# Patient Record
Sex: Male | Born: 2000 | Race: White | Hispanic: No | Marital: Single | Smoking: Never smoker
Health system: Southern US, Community
[De-identification: ages and names within clinical notes are randomized; demographics above are authoritative.]

---
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
## 2021-03-02 ENCOUNTER — Ambulatory Visit: Payer: Self-pay

## 2021-03-02 ENCOUNTER — Other Ambulatory Visit: Payer: Self-pay | Admitting: Family Medicine

## 2021-03-02 ENCOUNTER — Other Ambulatory Visit: Payer: Self-pay

## 2021-03-02 DIAGNOSIS — S20211A Contusion of right front wall of thorax, initial encounter: Secondary | ICD-10-CM

## 2021-12-29 IMAGING — DX DG RIBS 2V*R*
4 series · 4 of 4 positions shown · non-contrast
Comparison: None.

CLINICAL DATA: Right chest pain after blunt trauma today. Initial
encounter.

EXAM:
RIGHT RIBS - 2 VIEW

[rib pa]
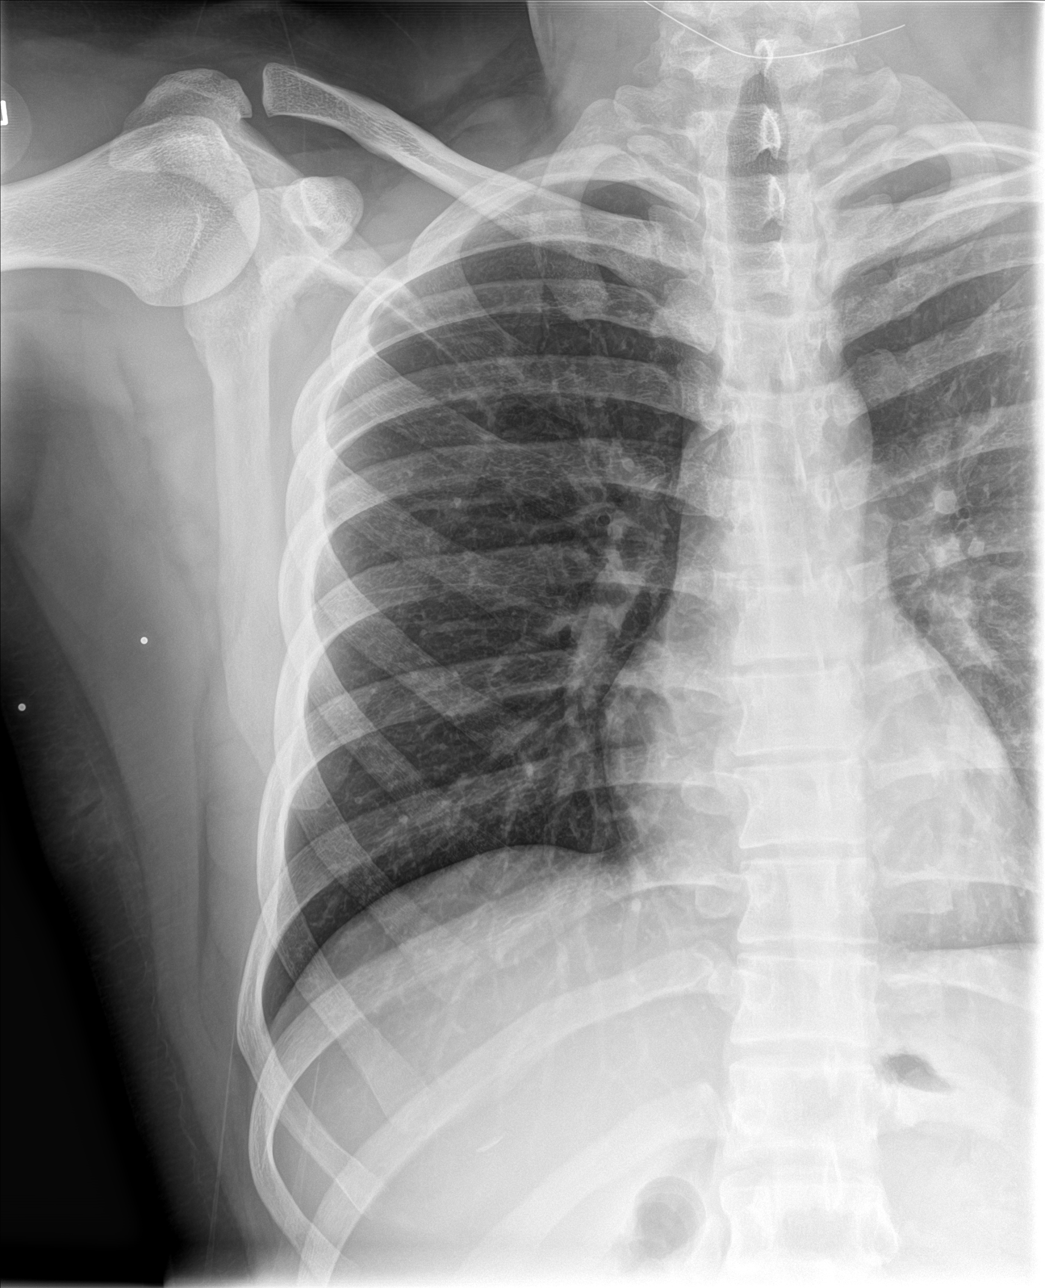

[rib obl (1 of 2)]
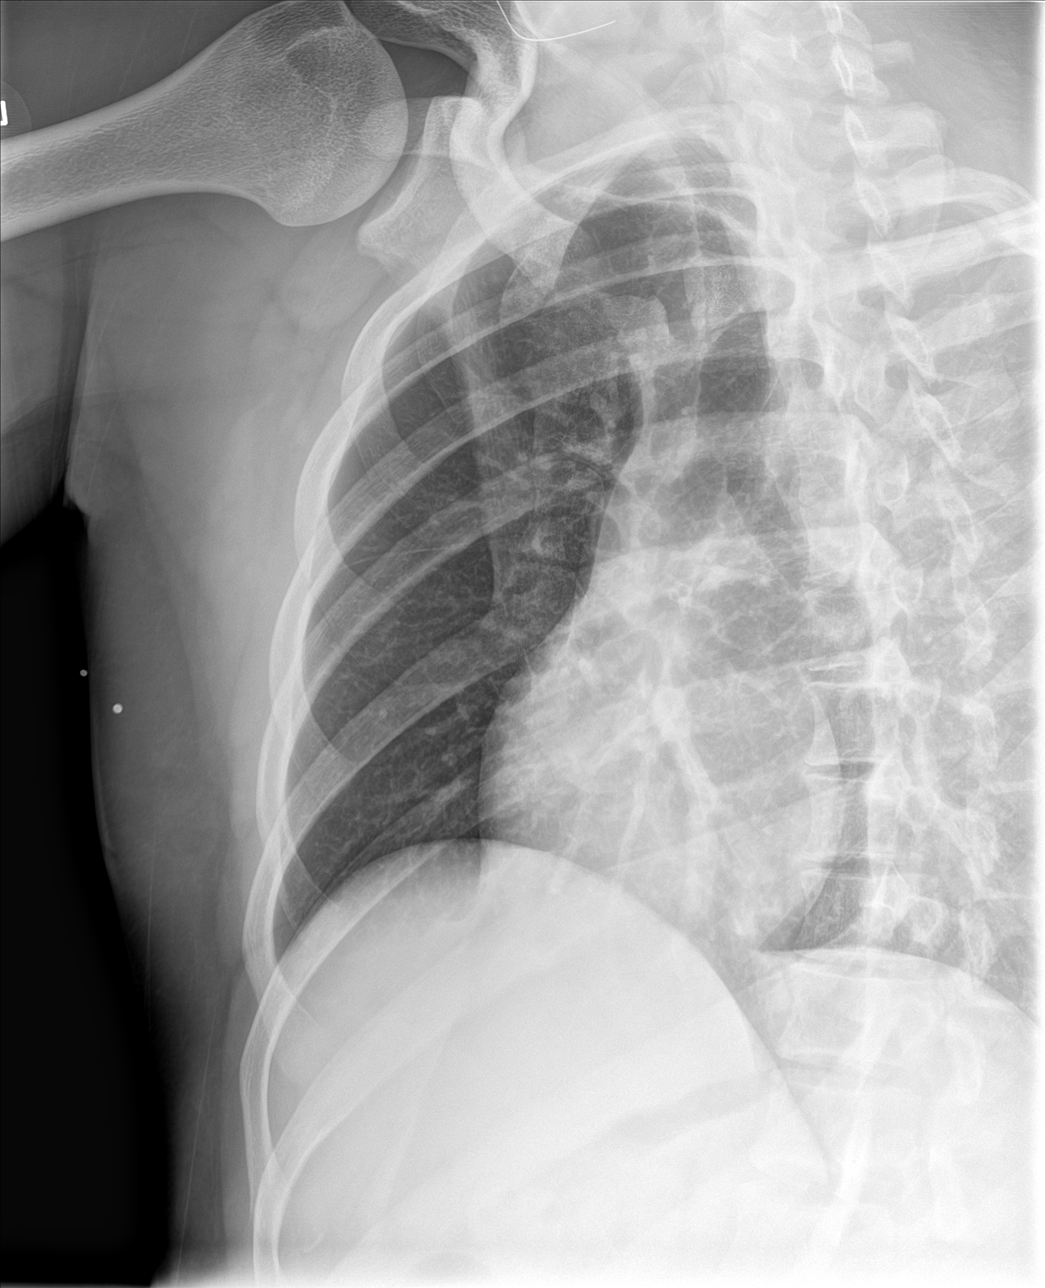

[rib obl (2 of 2)]
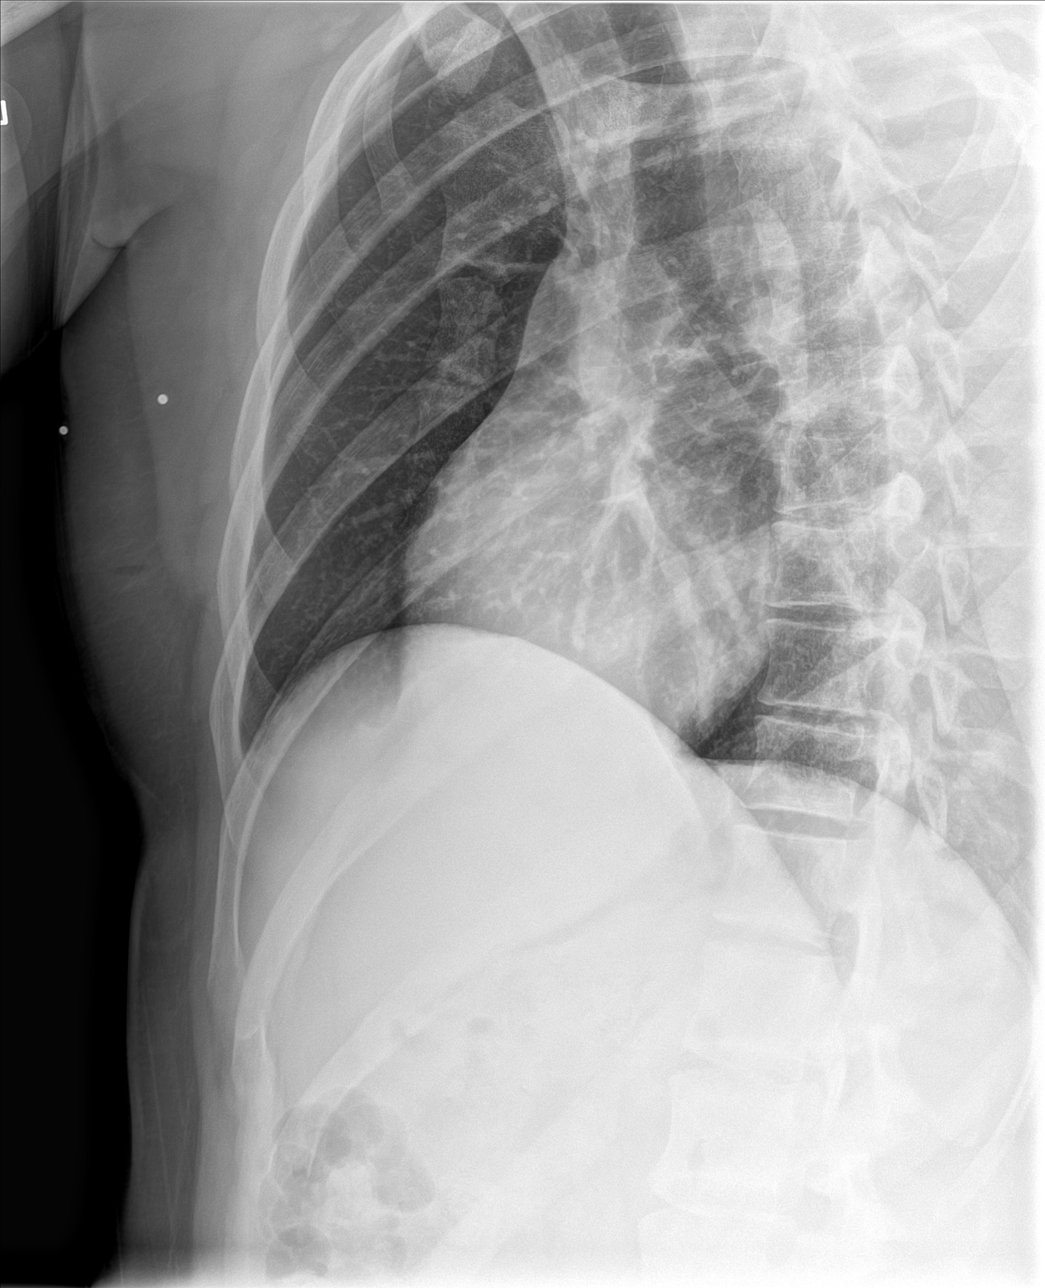

[chest pa]
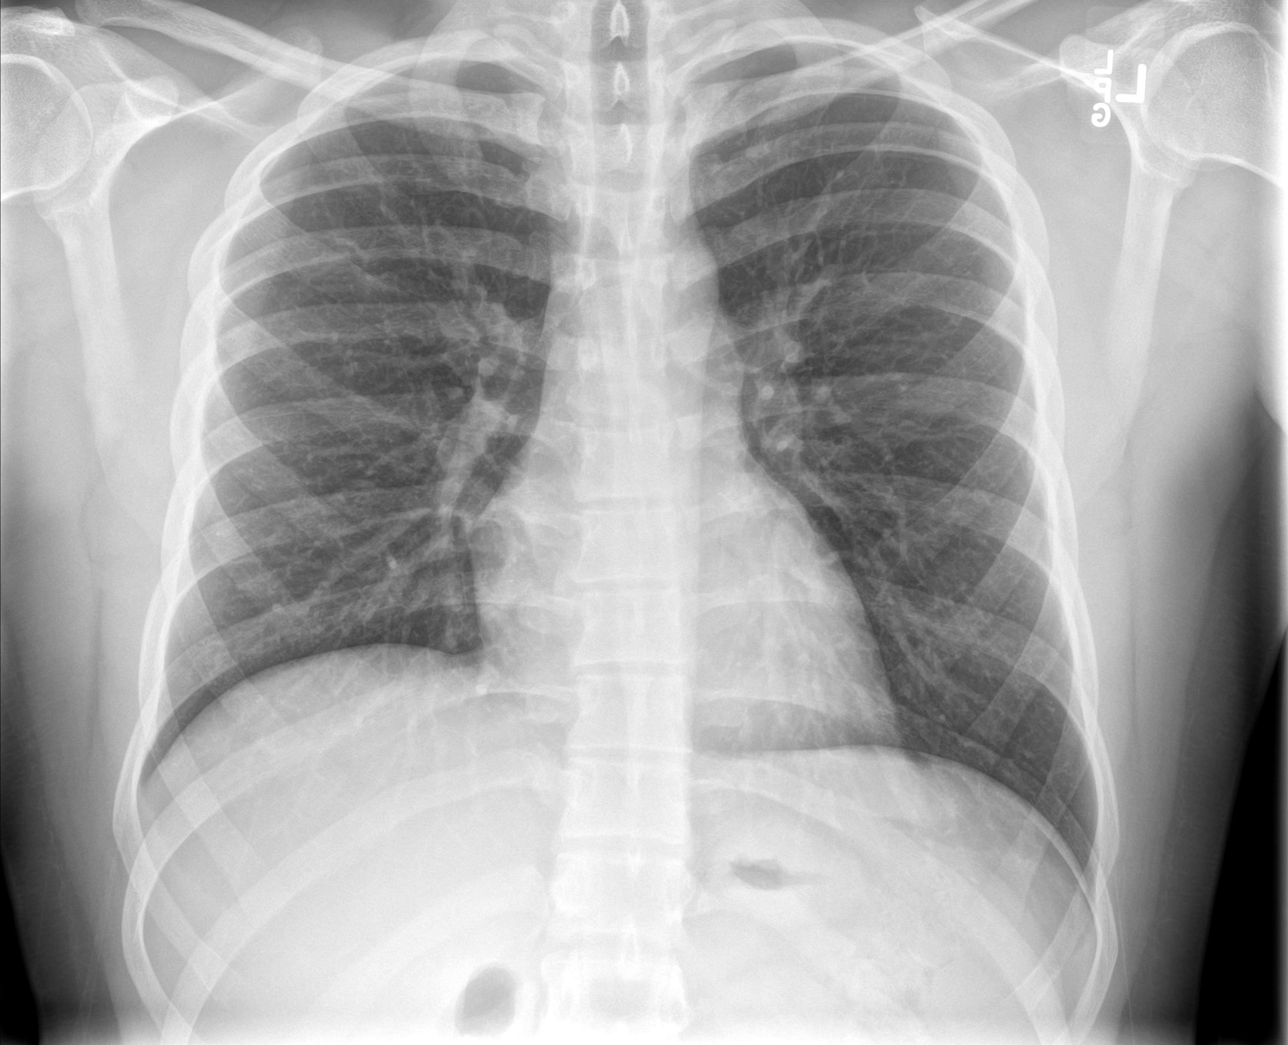

[4 of 4 positions shown; findings below may reference images not displayed]

FINDINGS: Single-view of the chest demonstrates clear lungs and normal heart
size. No pneumothorax or pleural fluid. Dedicated views of the right
ribs are normal.
IMPRESSION: Normal exam.
